# Patient Record
Sex: Female | Born: 1965 | Race: White | Hispanic: No | Marital: Married | Smoking: Former smoker
Health system: Southern US, Community
[De-identification: ages and names within clinical notes are randomized; demographics above are authoritative.]

## PROBLEM LIST (undated history)

## (undated) DIAGNOSIS — J449 Chronic obstructive pulmonary disease, unspecified: Secondary | ICD-10-CM

## (undated) HISTORY — PX: TUBAL LIGATION: SHX77

## (undated) HISTORY — PX: HERNIA REPAIR: SHX51

---

## 1999-07-22 ENCOUNTER — Encounter: Payer: Self-pay | Admitting: Family Medicine

## 1999-07-22 ENCOUNTER — Ambulatory Visit (HOSPITAL_COMMUNITY): Admission: RE | Admit: 1999-07-22 | Discharge: 1999-07-22 | Payer: Self-pay | Admitting: Family Medicine

## 2000-12-12 ENCOUNTER — Other Ambulatory Visit: Admission: RE | Admit: 2000-12-12 | Discharge: 2000-12-12 | Payer: Self-pay | Admitting: Obstetrics and Gynecology

## 2002-01-06 ENCOUNTER — Other Ambulatory Visit: Admission: RE | Admit: 2002-01-06 | Discharge: 2002-01-06 | Payer: Self-pay | Admitting: Obstetrics and Gynecology

## 2002-06-15 ENCOUNTER — Encounter: Admission: RE | Admit: 2002-06-15 | Discharge: 2002-06-15 | Payer: Self-pay | Admitting: Family Medicine

## 2002-06-15 ENCOUNTER — Encounter: Payer: Self-pay | Admitting: Family Medicine

## 2003-01-12 ENCOUNTER — Other Ambulatory Visit: Admission: RE | Admit: 2003-01-12 | Discharge: 2003-01-12 | Payer: Self-pay | Admitting: Obstetrics and Gynecology

## 2003-06-17 ENCOUNTER — Encounter: Admission: RE | Admit: 2003-06-17 | Discharge: 2003-06-17 | Payer: Self-pay | Admitting: Family Medicine

## 2004-01-18 ENCOUNTER — Other Ambulatory Visit: Admission: RE | Admit: 2004-01-18 | Discharge: 2004-01-18 | Payer: Self-pay | Admitting: Obstetrics and Gynecology

## 2004-05-10 ENCOUNTER — Other Ambulatory Visit: Admission: RE | Admit: 2004-05-10 | Discharge: 2004-05-10 | Payer: Self-pay | Admitting: Obstetrics and Gynecology

## 2004-09-04 ENCOUNTER — Other Ambulatory Visit: Admission: RE | Admit: 2004-09-04 | Discharge: 2004-09-04 | Payer: Self-pay | Admitting: Obstetrics and Gynecology

## 2006-02-13 ENCOUNTER — Encounter: Admission: RE | Admit: 2006-02-13 | Discharge: 2006-02-13 | Payer: Self-pay | Admitting: Family Medicine

## 2006-03-29 ENCOUNTER — Encounter: Admission: RE | Admit: 2006-03-29 | Discharge: 2006-03-29 | Payer: Self-pay | Admitting: Orthopedic Surgery

## 2007-07-11 ENCOUNTER — Encounter: Admission: RE | Admit: 2007-07-11 | Discharge: 2007-07-11 | Payer: Self-pay | Admitting: Family Medicine

## 2010-10-17 ENCOUNTER — Emergency Department (HOSPITAL_COMMUNITY): Payer: Managed Care, Other (non HMO)

## 2010-10-17 ENCOUNTER — Inpatient Hospital Stay (HOSPITAL_COMMUNITY)
Admission: EM | Admit: 2010-10-17 | Discharge: 2010-10-21 | DRG: 352 | Disposition: A | Discharge status: Home / Self Care | Payer: Managed Care, Other (non HMO) | Attending: General Surgery | Admitting: General Surgery

## 2010-10-17 DIAGNOSIS — IMO0001 Reserved for inherently not codable concepts without codable children: Principal | ICD-10-CM | POA: Diagnosis present

## 2010-10-17 DIAGNOSIS — F17200 Nicotine dependence, unspecified, uncomplicated: Secondary | ICD-10-CM | POA: Diagnosis present

## 2010-10-18 ENCOUNTER — Emergency Department (HOSPITAL_COMMUNITY): Payer: Managed Care, Other (non HMO)

## 2010-10-18 LAB — URINALYSIS, ROUTINE W REFLEX MICROSCOPIC
Glucose, UA: NEGATIVE mg/dL
Hgb urine dipstick: NEGATIVE
Specific Gravity, Urine: 1.019 (ref 1.005–1.030)
pH: 6.5 (ref 5.0–8.0)

## 2010-10-18 LAB — CBC
HCT: 40.8 % (ref 36.0–46.0)
MCV: 87.7 fL (ref 78.0–100.0)
RBC: 4.65 MIL/uL (ref 3.87–5.11)
RDW: 13.1 % (ref 11.5–15.5)
WBC: 13.9 10*3/uL — ABNORMAL HIGH (ref 4.0–10.5)

## 2010-10-18 LAB — POCT PREGNANCY, URINE: Preg Test, Ur: NEGATIVE

## 2010-10-18 LAB — LIPASE, BLOOD: Lipase: 72 U/L — ABNORMAL HIGH (ref 11–59)

## 2010-10-18 LAB — COMPREHENSIVE METABOLIC PANEL
ALT: 11 U/L (ref 0–35)
Calcium: 9.5 mg/dL (ref 8.4–10.5)
Chloride: 99 mEq/L (ref 96–112)
GFR calc Af Amer: >60 mL/min (ref >60)
GFR calc non Af Amer: >60 mL/min (ref >60)
Glucose, Bld: 112 mg/dL — ABNORMAL HIGH (ref 70–99)
Potassium: 3.9 mEq/L (ref 3.5–5.1)

## 2010-10-18 LAB — DIFFERENTIAL
Basophils Absolute: 0 10*3/uL (ref 0.0–0.1)
Lymphocytes Relative: 16 % (ref 12–46)
Lymphs Abs: 2.2 10*3/uL (ref 0.7–4.0)
Neutro Abs: 10.6 10*3/uL — ABNORMAL HIGH (ref 1.7–7.7)
Neutrophils Relative %: 76 % (ref 43–77)

## 2010-10-18 LAB — APTT: aPTT: 33 seconds (ref 24–37)

## 2010-10-18 LAB — PROTIME-INR: INR: 0.94 (ref 0.00–1.49)

## 2010-10-19 LAB — CBC
MCH: 29.5 pg (ref 26.0–34.0)
MCHC: 32.7 g/dL (ref 30.0–36.0)
Platelets: 285 10*3/uL (ref 150–400)
RBC: 3.56 MIL/uL — ABNORMAL LOW (ref 3.87–5.11)
RDW: 13.4 % (ref 11.5–15.5)

## 2010-10-24 NOTE — Op Note (Signed)
Vanessa Holloway, Vanessa Holloway      ACCOUNT NO.:  [id number]  MEDICAL RECORD NO.:  [id number]           PATIENT TYPE:  I  LOCATION:  1536                         FACILITY:  [facility]  PHYSICIAN:  Sharlet Salina T. Saydee Zolman, M.D.DATE OF BIRTH:  [birth year]  DATE OF PROCEDURE:  10/17/2010 DATE OF DISCHARGE:                              OPERATIVE REPORT   PREOPERATIVE DIAGNOSIS:  Incarcerated left inguinal hernia  POSTOPERATIVE DIAGNOSIS:  Incarcerated left femoral hernia.  SURGICAL PROCEDURES: 1. Repair of incarcerated left femoral hernia. 2. Exploratory laparotomy.  SURGEON:  Lorne Skeens. Sufyan Meidinger, M.D.  ANESTHESIA:  General.  BRIEF HISTORY:  Vanessa Holloway is a 45 year old female who has had intermittent bulge in her left groin for several months.  She presents today with at least 6 hours of persistent painful mass in the left groin with nausea.  Examination shows a 5-cm very tender mass at the inguinal canal consistent with an incarcerated hernia.  I have recommended emergency repair.  I discussed that bowel resection may be necessary. Risks of bleeding, infection, recurrence were discussed and understood. She is now brought to operating room for this procedure.  DESCRIPTION OF OPERATION:  The patient was brought to the operating room, placed in supine position on the operating room table and general endotracheal anesthesia was induced.  She received preoperative IV antibiotics.  PAS were in place.  The abdomen was widely sterilely prepped and draped.  Correct patient and procedure were verified.  I then made an oblique incision in the left groin overlying the mass. Dissection was carried down through the subcutaneous tissue and Scarpa fascia.  I came down upon the hernia sac which was dissected away from surrounding tissue.  The sac was beneath the inguinal ligament coming through the femoral canal.  I then opened the hernia sac which contained bloody fluid and a very dark short  segment of small intestine which was incarcerated in the hernia.  I was able to free this up by dividing a small portion of the inguinal ligament medially at the defect.  The bowel remained extremely dark and I suspected quite likely nonviable. Due to the very tight femoral space, I was really not able to free up the bowel enough to  carefully inspect or allow good blood flow to it and I felt at this point I had to return the bowel to the abdominal cavity, plan a minilaparotomy to probably resect the small bowel.  The bowel was returned to the abdominal cavity.  The hernia sac was excised with cautery and closed with a pursestring 3-0 Vicryl suture.  The femoral defect was then closed from below the inguinal ligament, suturing the inguinal ligament down to Cooper's ligament beginning at the lacunar ligament and working over to and carefully avoiding the femoral vein with several interrupted 0 Prolene sutures.  Soft tissue was then infiltrated with Marcaine.  Scarpa fascia was closed with a running 3-0 Vicryl, the skin closed with running subcuticular 4-0 Monocryl.  Then, again in order to rule out necrotic bowel and expecting likely to require bowel resection, I made a small about 5 cm laparotomy incision in the midline below the umbilicus.  Dissection was carried down through  subcutaneous tissue in midline fashion.  The peritoneum entered.  The Alexis wound retractor was placed.  The small bowel was then run and the involved segment brought out through the wound.  At this point, it had pinked up significantly and although was hemorrhagic over about a 2-3 cm length it did clearly appear viable and I did not feel that it needed to be resected.  There was not any cloudy or bloody peritoneal fluid.  Therefore, the bowel was returned to the abdominal cavity and the wound was closed with running #1 PDS begun either end and tied centrally.  Subcu was closed with running 3-0 Vicryl, skin  with subcuticular Monocryl and Dermabond.  Sponge and needle count was correct.  Dermabond was applied to both incisions.  The patient was taken to recovery in good condition.     Lorne Skeens. [place]re Harbor, M.D.     Tory Emerald  D:  10/18/2010  T:  10/18/2010  Job:  [id number]  Electronically Signed by Glenna Fellows M.D. on 10/24/2010 10:11:03 AM

## 2010-10-24 NOTE — H&P (Signed)
Vanessa Holloway, Vanessa Holloway      ACCOUNT NO.:  [id number]  MEDICAL RECORD NO.:  [id number]           PATIENT TYPE:  E  LOCATION:  WLED                         FACILITY:  [facility]  PHYSICIAN:  Sharlet Salina T. Mycheal Veldhuizen, M.D.DATE OF BIRTH:  11-11-1965  DATE OF ADMISSION:  10/17/2010 DATE OF DISCHARGE:                             HISTORY & PHYSICAL   CHIEF COMPLAINT:  Painful lump, left groin.  HISTORY OF PRESENT ILLNESS:  Vanessa Holloway is a generally healthy 45- year-old female, who about 4 months ago developed a nonpainful intermittent bulge in her left groin.  She did see her primary care physician at that point, but was told it was likely a pulled muscle. She had some intermittent swelling related activity in the left groin, but no pain since that time.  However, today after working out in [employer], she developed burning, diffuse lower abdominal pain and then a much increased, very painful lump in the left groin, which has persisted. This has now been present for about 8 or 10 hours.  The patient presented to [facility] Emergency Room and I was asked to evaluate her.  PAST MEDICAL HISTORY:  She has not had any previous surgery or serious medical illness.  MEDICATIONS:  P.r.n., naproxen and Chantix or tobacco cessation  ALLERGIES:  None.  SOCIAL HISTORY:  Married.  Drinks occasional alcohol.  Currently, smoking cigarettes, trying to quit.  FAMILY HISTORY:  Her father had incarcerated inguinal hernia.  REVIEW OF SYSTEMS:  GENERAL:  No fever, chills, weight change. HEENT:  No vision, hearing, swallowing problems. RESPIRATORY:  No shortness of breath, cough, history of lung disease. CARDIAC:  No chest pain, palpitation swelling, history of heart disease. ABDOMEN/GI:  She has some nausea.  No vomiting.  PHYSICAL EXAMINATION:  VITAL SIGNS:  She is afebrile, heart rate 76, blood pressure 109/75, respirations 18. GENERAL:  Well-developed, alert, white female, somewhat  uncomfortable. SKIN:  Warm and dry.  No rash or infection. HEENT:  No palpable mass or thyromegaly  Sclerae nonicteric.  Oropharynx clear. LYMPH NODES:  No cervical or subclavicular nodes palpable. LUNGS:  Clear without wheezing or increased work of breathing. CARDIAC:  Regular rate and rhythm.  No murmurs. ABDOMEN:  Mild distention.  Bowel sounds are hyperactive. ABDOMEN:  Generally soft and nontender.  There is a 5 cm fusiform exquisitely tender lump in the left inguinal canal without any overlying edema or erythema.  Not reducible.  Right groin is negative. EXTREMITIES:  No joint swelling or deformity. NEUROLOGIC:  Alert, oriented.  Motor sensation is grossly normal.  LABORATORY DATA:  Lactic acid, lipase, electrolytes, LFTs all within normal limits.  White count elevated at 13.9, hemoglobin 35.  Pregnancy negative.  Urinalysis negative.  DIAGNOSTIC STUDIES:  Flat and upright of abdomen shows a clear chest, few air-fluid levels, left upper quadrant consistent with ileus without dilated bowel.  ASSESSMENT AND PLAN:  Apparent incarcerated left inguinal hernia.  Her history is classic.  Could conceivably be lymphadenopathy or groin abscess, but this is extremely unlikely.  She may well have bowel incarceration.  I have recommended emergency exploration repair under general anesthesia.  The patient is admitted for this procedure.  Vanessa Holloway, M.D.     Vanessa Holloway  D:  10/18/2010  T:  10/18/2010  Job:  [id number]  Electronically Signed by Glenna Fellows M.D. on 10/24/2010 10:10:57 AM

## 2010-11-02 NOTE — Discharge Summary (Addendum)
NAMEKATHLYNN, Holloway      ACCOUNT NO.:  [id number]  MEDICAL RECORD NO.:  [id number]  LOCATION:  1536                         FACILITY:  [facility]  PHYSICIAN:  Angelia Mould. Derrell Lolling, M.D.DATE OF BIRTH:  Jan 10, 1966  DATE OF ADMISSION:  10/17/2010 DATE OF DISCHARGE:  10/21/2010                              DISCHARGE SUMMARY   DISCHARGING PHYSICIAN:  Angelia Mould. Derrell Lolling, MD  CONSULTANTS:  None.  PROCEDURE:  Repair of incarcerated left femoral hernia with exploratory laparotomy by Dr. Glenna Fellows on Oct 17, 2010.  REASON FOR ADMISSION:  Ms. Deboy is a healthy 45 year old female who developed a nonpainful intermittent bulge in her left groin approximately 4 months ago.  She saw her primary care physician who thought it was likely a pulled muscle.  She had intermittent swelling, related to activity, but no pain.  Today during a workout, she developed burning, diffuse lower abdominal pain that has significantly increased as well as a very painful lump in the left groin.  The patient presented to [facility] Emergency Department for further evaluation.  She had a plain abdominal x-ray which revealed a few air-fluid levels, consistent with ileus without dilated bowel.  After evaluation, she was found to have an incarcerated left inguinal hernia. Please see admitting history and physical for further details.  ADMITTING DIAGNOSIS:  Apparent incarcerated left inguinal hernia.  HOSPITAL COURSE:  At this time, the patient was admitted.  It was felt the patient would need emergent repair of her hernia.  Therefore, she was taken to the operating room where she underwent a repair of an incarcerated left inguinal hernia with an exploratory laparotomy to ensure that there was no ischemic bowel.  There was no ischemic bowel. The patient tolerated the procedure well.  Her postoperative course was quite uneventful. On postoperative day #0, the patient was kept n.p.o. except for ice  chips.  She was encouraged to ambulate and get out of bed.  The following day, the patient was not passing any flatus, however, she was hungry and did have active bowel sounds.  At this time, she was started on clear liquids.  Over the next several days, her diet was advanced as tolerated.  By postoperative day #3, the patient was doing well.  She was tolerating a regular diet and her pain was well controlled.  On exam, her abdomen was soft and mildly tender.  Her scar was clean and dry.  She did have active bowel sounds.  At this time, the patient was felt stable for discharge home.  DISCHARGE DIAGNOSIS:  Incarcerated left femoral hernia, status post repair with exploratory laparotomy.  DISCHARGE MEDICATIONS:  Please see medication reconciliation form.  DISCHARGE INSTRUCTIONS:  The patient may increase her activity slowly and walk up steps.  She may shower, however, she is not to bathe.  She is not to do any heavy lifting for the next 4 weeks.  She is not to drive for the next 1 week.  She may resume a normal diet.  She may follow up with Dr. Johna Sheriff in the next 2-3 weeks.Letha Cape, PA   ______________________________ Angelia Mould. Derrell Lolling, M.D.    KEO/MEDQ  D:  11/01/2010  T:  11/02/2010  Job:  [id number]  cc:   Lorne Skeens. Hoxworth, M.D. [address], [address] [place] [address]  Electronically Signed by Claud Kelp M.D. on 11/02/2010 04:13:27 PM Electronically Signed by Barnetta Chapel PA on 11/09/2010 10:30:33 AM

## 2010-11-03 ENCOUNTER — Encounter (HOSPITAL_COMMUNITY): Payer: Self-pay

## 2010-11-03 ENCOUNTER — Other Ambulatory Visit (HOSPITAL_COMMUNITY): Payer: Self-pay | Admitting: General Surgery

## 2010-11-03 ENCOUNTER — Inpatient Hospital Stay (HOSPITAL_COMMUNITY)
Admission: EM | Admit: 2010-11-03 | Discharge: 2010-11-08 | DRG: 390 | Disposition: A | Discharge status: Home / Self Care | Payer: Managed Care, Other (non HMO) | Source: Ambulatory Visit | Attending: General Surgery | Admitting: General Surgery

## 2010-11-03 ENCOUNTER — Other Ambulatory Visit: Payer: Self-pay | Admitting: General Surgery

## 2010-11-03 DIAGNOSIS — F17200 Nicotine dependence, unspecified, uncomplicated: Secondary | ICD-10-CM | POA: Diagnosis present

## 2010-11-03 DIAGNOSIS — K56609 Unspecified intestinal obstruction, unspecified as to partial versus complete obstruction: Principal | ICD-10-CM | POA: Diagnosis present

## 2010-11-03 LAB — COMPREHENSIVE METABOLIC PANEL
ALT: 19 U/L (ref 0–35)
AST: 18 U/L (ref 0–37)
Albumin: 3.7 g/dL (ref 3.5–5.2)
Alkaline Phosphatase: 57 U/L (ref 39–117)
Chloride: 98 mEq/L (ref 96–112)
Potassium: 3.8 mEq/L (ref 3.5–5.1)
Sodium: 135 mEq/L (ref 135–145)
Total Bilirubin: 0.3 mg/dL (ref 0.3–1.2)

## 2010-11-03 LAB — CBC
HCT: 38.7 % (ref 36.0–46.0)
Platelets: 442 10*3/uL — ABNORMAL HIGH (ref 150–400)
RDW: 13.3 % (ref 11.5–15.5)
WBC: 17.4 10*3/uL — ABNORMAL HIGH (ref 4.0–10.5)

## 2010-11-03 LAB — CBC WITH DIFFERENTIAL/PLATELET
HCT: 39.2 % (ref 36.0–46.0)
Lymphocytes Relative: 17 % (ref 12–46)
MCHC: 34.9 g/dL (ref 30.0–36.0)
Monocytes Absolute: 0.6 10*3/uL (ref 0.1–1.0)
Neutro Abs: 10.9 10*3/uL — ABNORMAL HIGH (ref 1.7–7.7)
Neutrophils Relative %: 79 % — ABNORMAL HIGH (ref 43–77)
Platelets: 448 10*3/uL — ABNORMAL HIGH (ref 150–400)
RDW: 13.4 % (ref 11.5–15.5)
WBC: 13.9 10*3/uL — ABNORMAL HIGH (ref 4.0–10.5)

## 2010-11-03 MED ORDER — IOHEXOL 300 MG/ML  SOLN
80.0000 mL | Freq: Once | INTRAMUSCULAR | Status: AC | PRN
  Administered 2010-11-03: 80 mL via INTRAVENOUS

## 2010-11-04 ENCOUNTER — Inpatient Hospital Stay (HOSPITAL_COMMUNITY): Payer: Managed Care, Other (non HMO)

## 2010-11-05 ENCOUNTER — Inpatient Hospital Stay (HOSPITAL_COMMUNITY): Payer: Managed Care, Other (non HMO)

## 2010-11-05 LAB — CBC
HCT: 37.2 % (ref 36.0–46.0)
MCH: 29.2 pg (ref 26.0–34.0)
MCV: 87.7 fL (ref 78.0–100.0)
Platelets: 409 10*3/uL — ABNORMAL HIGH (ref 150–400)
RDW: 13.8 % (ref 11.5–15.5)

## 2010-11-05 LAB — BASIC METABOLIC PANEL
BUN: 12 mg/dL (ref 6–23)
CO2: 34 mEq/L — ABNORMAL HIGH (ref 19–32)
Calcium: 9.1 mg/dL (ref 8.4–10.5)
Creatinine, Ser: 0.73 mg/dL (ref 0.4–1.2)
GFR calc Af Amer: >60 mL/min (ref >60)

## 2010-11-06 LAB — BASIC METABOLIC PANEL
BUN: 10 mg/dL (ref 6–23)
Chloride: 106 mEq/L (ref 96–112)
Creatinine, Ser: 0.62 mg/dL (ref 0.4–1.2)
GFR calc Af Amer: >60 mL/min (ref >60)
GFR calc non Af Amer: >60 mL/min (ref >60)

## 2010-11-06 LAB — CBC
MCHC: 33.1 g/dL (ref 30.0–36.0)
MCV: 87.3 fL (ref 78.0–100.0)
Platelets: 398 10*3/uL (ref 150–400)
RDW: 13.7 % (ref 11.5–15.5)
WBC: 6.4 10*3/uL (ref 4.0–10.5)

## 2010-11-09 NOTE — H&P (Signed)
NAMEASLI, TOKARSKI      ACCOUNT NO.:  [id number]  MEDICAL RECORD NO.:  [id number]  LOCATION:  5125                         FACILITY:  [facility]  PHYSICIAN:  Almond Lint, MD       DATE OF BIRTH:  1965-06-25  DATE OF ADMISSION:  11/03/2010 DATE OF DISCHARGE:                             HISTORY & PHYSICAL   CHIEF COMPLAINT:  Small bowel obstruction.  HISTORY OF PRESENT ILLNESS:  [name] is a 45 year old female who underwent femoral hernia repair and minilaparotomy on Oct 18, 2010 for an incarcerated femoral hernia.  At that time she had an area of loops of bowel that appeared slightly dusky.  The minilaparotomy was made and the loop of bowel disappeared, slightly hemorrhagic but had pinked up. She was able to go home on 26 without incident.  Dr. Johna Sheriff saw her in clinic and she was doing well.  Today, however, she came to clinic because she was having a burning abdominal pain, severe nausea, vomiting, and flushing.  She underwent CT scanning which is positive for bowel obstruction.  She tried to have bowel movements to help but this did not.  Her last bowel movement was around 16 hours ago.  She thinks she passed some gas at that time.  She has complained of fevers and sweats.  She has not been able to keep anything down.  PAST MEDICAL HISTORY:  Otherwise negative.  PAST SURGICAL HISTORY:  Femoral hernia repair and minilaparotomy as described above.  FAMILY HISTORY:  Her father had an incarcerated inguinal hernia.  SOCIAL HISTORY:  She is trying to quit tobacco.  Drinks alcohol occasionally and does not use drugs.  She is married.  REVIEW OF SYSTEMS:  Otherwise negative x11.  MEDICATIONS:  Naproxen and Chantix.  ALLERGIES:  None.  PHYSICAL EXAMINATION:  VITAL SIGNS:  Temperature 97.9, pulse 77, blood pressure 123/82, respiratory rate 18, sats 100% on room air. GENERAL:  Alert and oriented x3 in no acute distress. HEENT:  Normocephalic, atraumatic.   Sclerae are anicteric. NECK:  Supple.  No lymphadenopathy, no thyromegaly.  Trachea is midline. HEART:  Regular rate and rhythm.  No murmurs, rubs, or gallops. LUNGS:  Clear to auscultation bilaterally. ABDOMEN:  Soft, mildly distended, hypoactive bowel sounds.  No hepatosplenomegaly.  She states her abdomen is less hard than it was when the NG tube went in.  EXTREMITIES:  Warm and well-perfused without pitting edema.  No gross motor sensory deficits. PSYCH:  Mood and affect are normal.  White count 17.4, hemoglobin 13.6, hematocrit 38.7, platelet count 442,000.  BMP:  Sodium 135, potassium 3.8, chloride 98, CO2 26, glucose 126, BUN 16, creatinine 0.6, bilirubin 0.3, alk phos 57, AST 18, ALT 19, albumin 3.7.  CT scan of the abdomen and pelvis with p.o. and IV contrast demonstrates small bowel loops that are dilated with transition point distally and collapsed, moderate amount of free fluid in the pelvis.  ASSESSMENT:  Vanessa Holloway is a 45 year old female with lysis of small bowel obstruction following a femoral hernia repair.PLAN:  IV fluids, n.p.o., NG tube, pain control, anti-emetics and if her bowel obstruction does not resolve, she may require operative exploration.     Almond Lint, MD     FB/MEDQ  D:  11/03/2010  T:  11/04/2010  Job:  [id number]  Electronically Signed by Almond Lint MD on 11/09/2010 02:10:59 PM

## 2010-11-17 ENCOUNTER — Encounter (INDEPENDENT_AMBULATORY_CARE_PROVIDER_SITE_OTHER): Payer: Self-pay | Admitting: General Surgery

## 2010-11-23 NOTE — Discharge Summary (Signed)
  NAMEJAZZELLE, Vanessa Holloway NO.:  [id number]  MEDICAL RECORD NO.:  [id number]  LOCATION:  5125                         FACILITY:  [facility]  PHYSICIAN:  Ardeth Sportsman, MD     DATE OF BIRTH:  1965-11-22  DATE OF ADMISSION:  11/03/2010 DATE OF DISCHARGE:  11/08/2010                              DISCHARGE SUMMARY   ADMISSION DIAGNOSES: 1. Small bowel obstruction; status post incarcerated hernia repair, Oct 18, 2010, Dr. Johna Sheriff. 2. Tobacco use.  DISCHARGE DIAGNOSES: 1. Small bowel obstruction; status post incarcerated hernia repair, Oct 18, 2010, Dr. Johna Sheriff. 2. Tobacco use.  PROCEDURES:  None.  BRIEF HISTORY:  The patient is a 45 year old female who underwent laparotomy repair of incarcerated hernia,10/18/10.  She had some bowel loops that was slightly dusky at the time of surgery.  The bowel improved at the time of surgery,  and she did well. She has been seen back in the clinic and did well up until the day of admission.  She developed some burning discomfort, nausea, vomiting, and flushing at that time. She was seen in the office and a CT scan revealed a small bowel obstruction.  She was subsequently admitted.  For further history and physical, please see the dictated note.  HOSPITAL COURSE:  The patient was admitted.  Her white count was 17.4 on admission.  Electrolytes and LFTs were normal.  She was placed on bowel rest, fluid hydration and she slowly improved.  She was started on clear liquids on November 06, 2010 and after receiving Dulcolax had a bowel movement in the afternoon of June 11 and then one around midnight the following morning. We advanced her to full liquids on June 12.  She was seen by Dr. Johna Sheriff.  It was his opinion that further studies at this point were not necessary.  He recommended that if she did well she could go home on full liquids and follow up with him next week.  Yesterday, June 12, she has tolerated full liquid.  She had  one stool last night.  She was having some flatus, not a great deal.  Her incision is healing nicely. She is ambulating without difficulty.  The patient was given the option of staying longer or going home and she chose to go home.  We will send her home on full liquids and leave her on full liquids for the next 72 hours and then advance to regular soft diet if she is tolerating that well, having bowel movements and passing flatus normally.  She is to call and reschedule an appointment with Dr. Johna Sheriff for next week.  CONDITION ON DISCHARGE:  Improving.     Eber Hong, P.A.   ______________________________ Ardeth Sportsman, MD    WDJ/MEDQ  D:  11/08/2010  T:  11/09/2010  Job:  [id number]  Electronically Signed by Sherrie George P.A. on 11/17/2010 09:04:01 PM Electronically Signed by Karie Soda MD on 11/23/2010 07:13:50 AM

## 2011-03-20 ENCOUNTER — Other Ambulatory Visit: Payer: Self-pay | Admitting: Obstetrics and Gynecology

## 2011-03-20 DIAGNOSIS — R928 Other abnormal and inconclusive findings on diagnostic imaging of breast: Secondary | ICD-10-CM

## 2011-03-30 ENCOUNTER — Ambulatory Visit
Admission: RE | Admit: 2011-03-30 | Discharge: 2011-03-30 | Disposition: A | Discharge status: Home / Self Care | Payer: Managed Care, Other (non HMO) | Source: Ambulatory Visit | Attending: Obstetrics and Gynecology | Admitting: Obstetrics and Gynecology

## 2011-03-30 DIAGNOSIS — R928 Other abnormal and inconclusive findings on diagnostic imaging of breast: Secondary | ICD-10-CM

## 2011-12-04 ENCOUNTER — Ambulatory Visit
Admission: RE | Admit: 2011-12-04 | Discharge: 2011-12-04 | Disposition: A | Discharge status: Home / Self Care | Payer: Managed Care, Other (non HMO) | Source: Ambulatory Visit | Attending: Family Medicine | Admitting: Family Medicine

## 2011-12-04 ENCOUNTER — Other Ambulatory Visit: Payer: Self-pay | Admitting: Family Medicine

## 2011-12-04 DIAGNOSIS — M25512 Pain in left shoulder: Secondary | ICD-10-CM

## 2011-12-04 DIAGNOSIS — M542 Cervicalgia: Secondary | ICD-10-CM

## 2011-12-04 DIAGNOSIS — M25511 Pain in right shoulder: Secondary | ICD-10-CM

## 2012-04-12 IMAGING — CT CT ABD-PELV W/ CM
2 of 5 series · 17 of 46 positions shown, 19 images · IV contrast (agent unspecified)
Comparison: None

CLINICAL DATA: Evaluate for small bowel obstruction status post
hernia surgery

CT ABDOMEN AND PELVIS WITH CONTRAST
TECHNIQUE: Multidetector CT imaging of the abdomen and pelvis was
performed following the standard protocol during bolus
administration of intravenous contrast.
Contrast: 80 ml of omni 300

[Series 2: routine · axial · 0.71mm/px · z∈[-517,-102]mm · 14 of 95 slices shown, 16 images]
[im 6/95  soft-tissue]
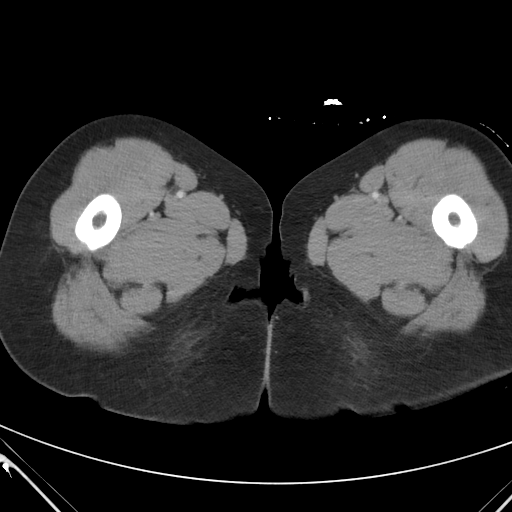
[im 6/95  bone]
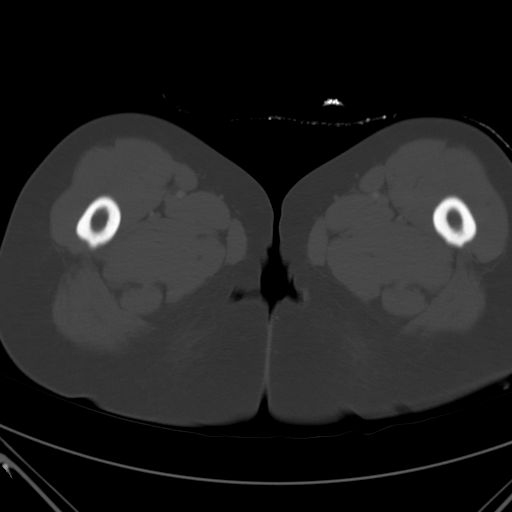
[im 11/95  soft-tissue]
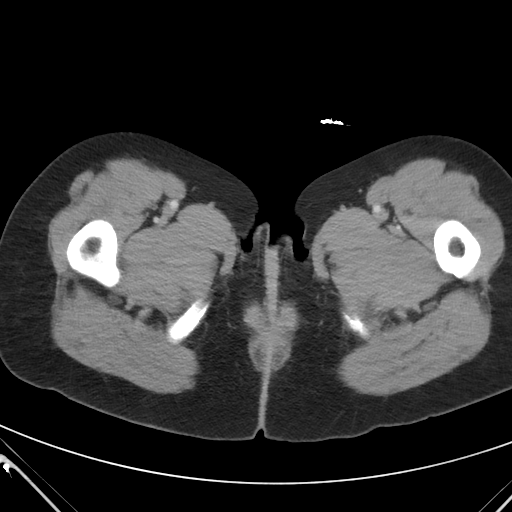
[im 21/95  soft-tissue]
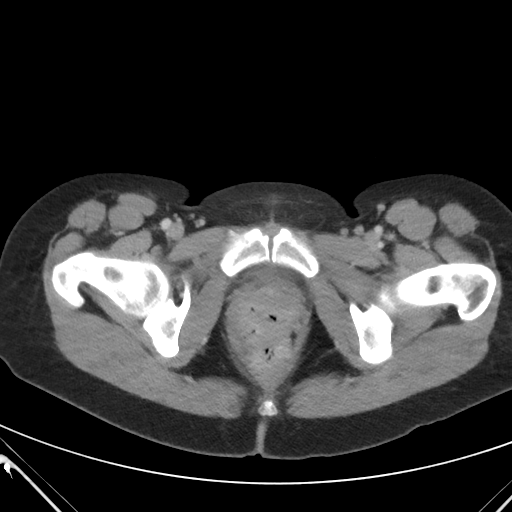
[im 27/95  soft-tissue]
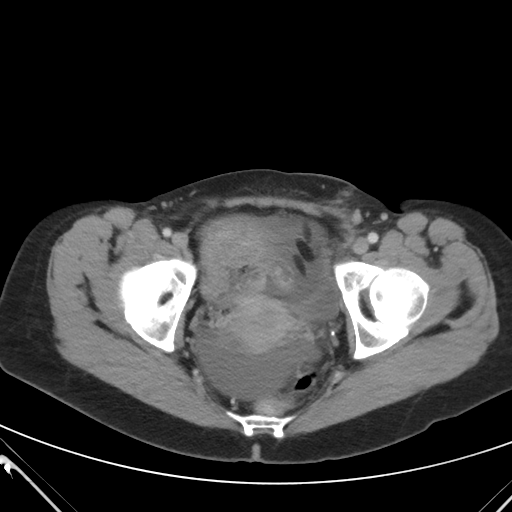
[im 32/95  soft-tissue]
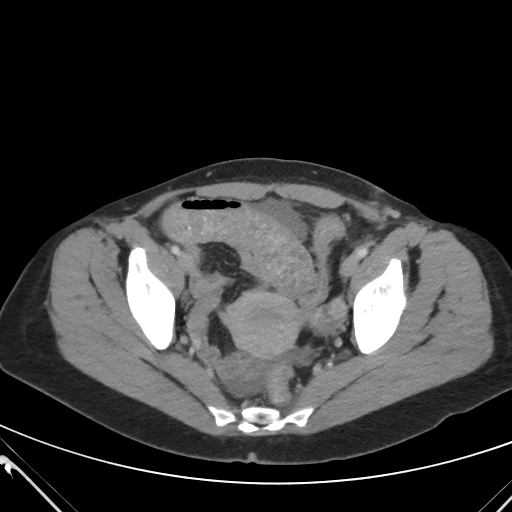
[im 37/95  soft-tissue]
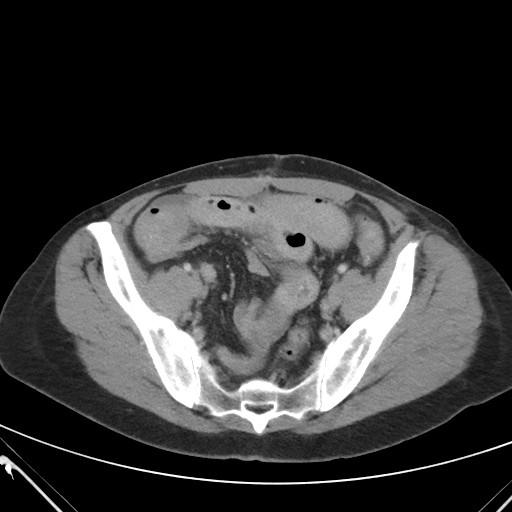
[im 42/95  soft-tissue]
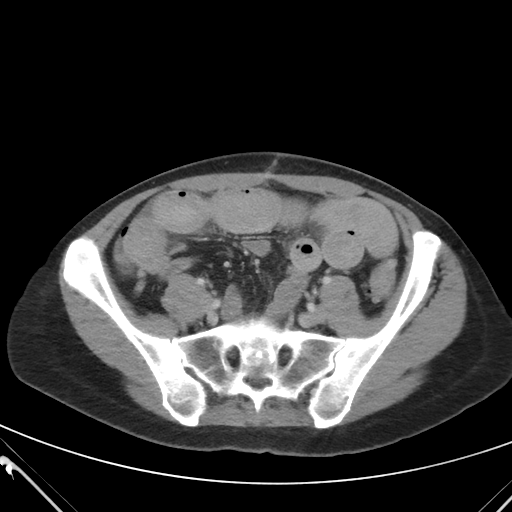
[im 53/95  soft-tissue]
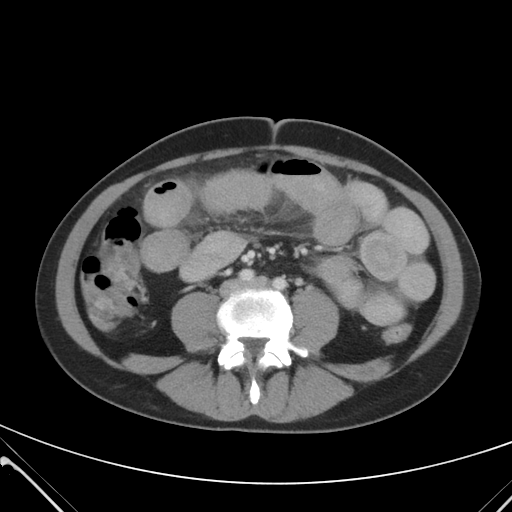
[im 58/95  soft-tissue]
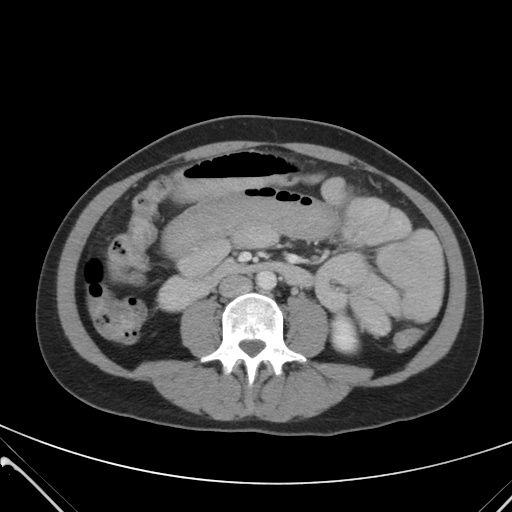
[im 58/95  bone]
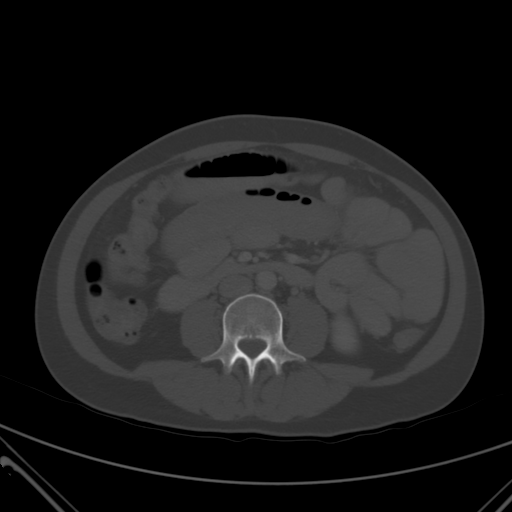
[im 63/95  soft-tissue]
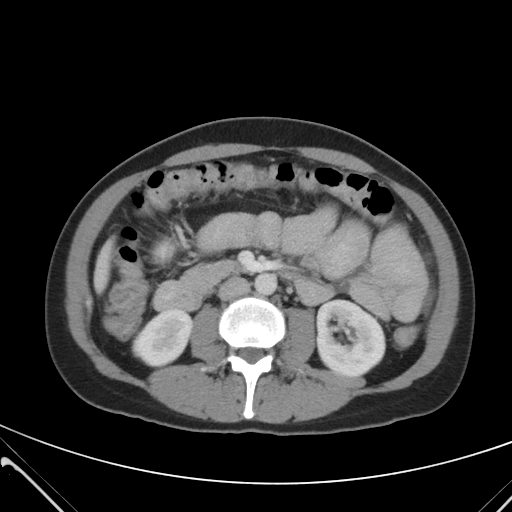
[im 68/95  soft-tissue]
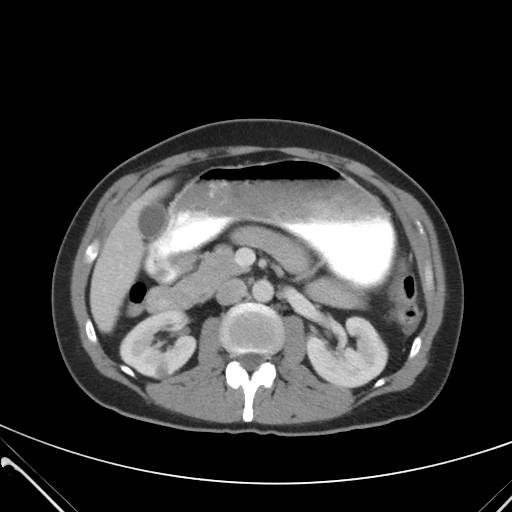
[im 74/95  soft-tissue]
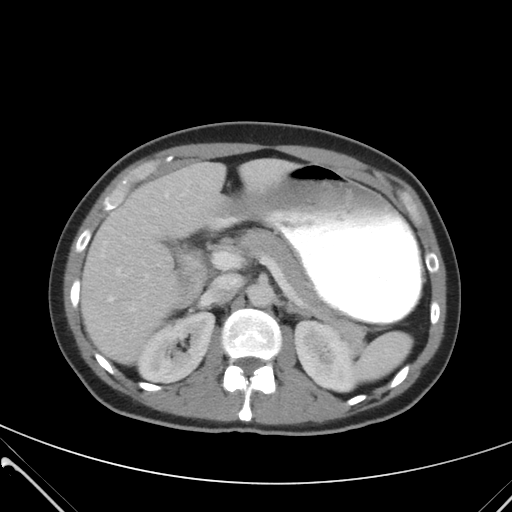
[im 84/95  soft-tissue]
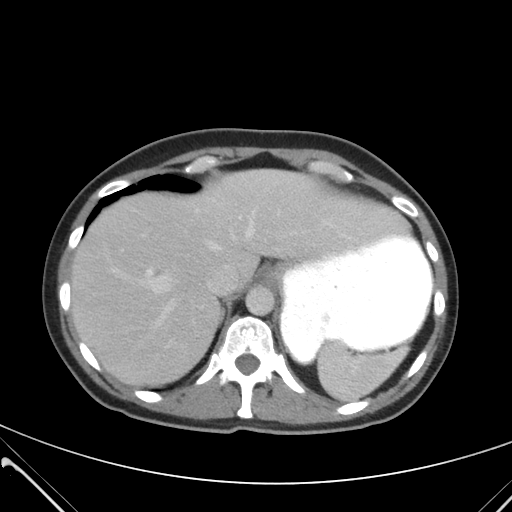
[im 89/95  soft-tissue]
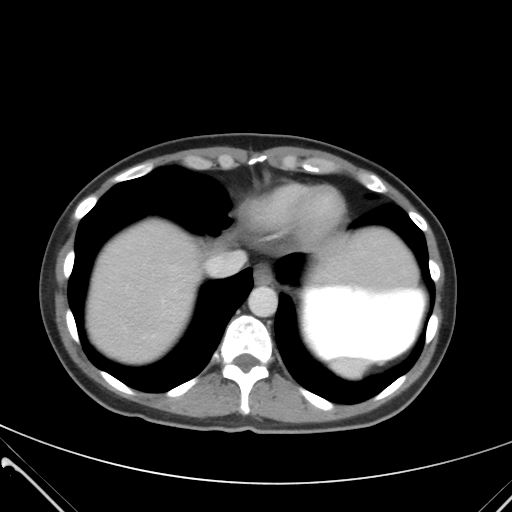

[mpr, coronals, coronal · coronal · 0.92mm/px · 3 of 88 slices shown]
[im 30/88  soft-tissue]
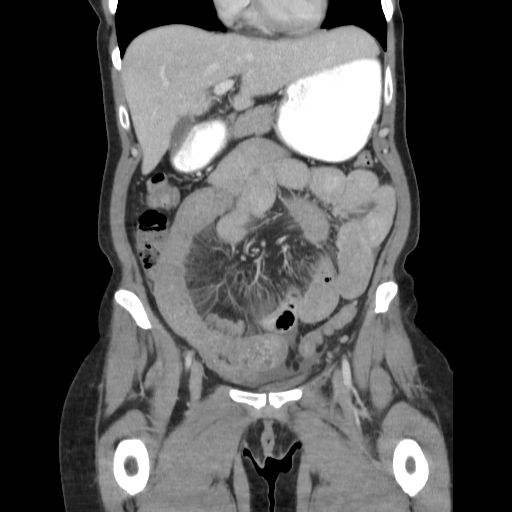
[im 39/88  soft-tissue]
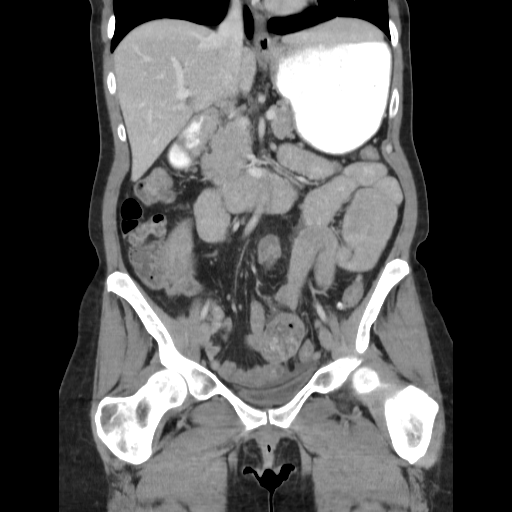
[im 49/88  soft-tissue]
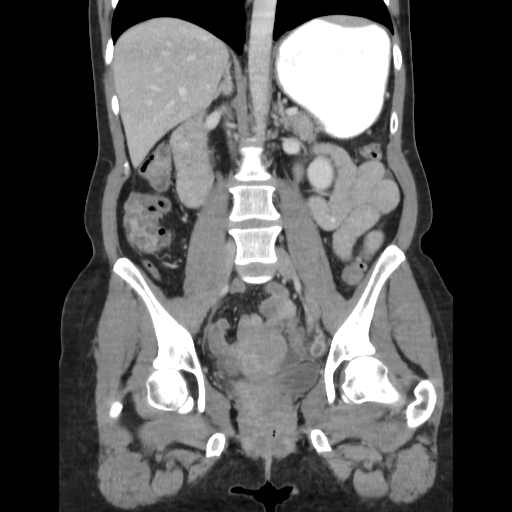

[17 of 46 positions shown; findings below may reference images not displayed]

FINDINGS: The lung bases are clear.

No suspicious liver abnormalities.

The gallbladder appears normal.

The common bile duct has a normal caliber.

The pancreas appears normal.

The adrenal glands are within normal limits.

There is no focal splenic abnormalities identified.

Right renal cyst.  Left kidney negative.

There are no enlarged upper abdominal lymph nodes.  There are no
enlarged pelvic or inguinal lymph nodes.

Distention of the gastric lumen.  Small bowel loops are dilated
measuring up to the proximal and mid small bowel loops are dilated.
The distal ileum is collapsed.  The appendix is identified and
appears normal.  The proximal colon appears normal.  Sigmoid
diverticula noted without evidence for acute diverticulitis.
Transition point is identified within the pelvis, slightly ecentric
to the left of the midline.  This is best seen on coronal image
number 48.

Moderate free fluid identified within the pelvis.

There are no specific features to suggest bowel perforation.  No
abscess is identified.
IMPRESSION: 1.  Examination is positive for small bowel obstruction.  The
transition point is in the pelvis at the level of the mid small
bowel.  The distal ileum appears collapsed.

2.  Moderate free fluid within the pelvis.

## 2012-04-14 IMAGING — CR DG ABDOMEN 2V
2 series · 2 of 2 positions shown · non-contrast
Comparison: Two views abdomen 11/04/2010.

CLINICAL DATA: Small bowel obstruction.

ABDOMEN - 2 VIEW

[w abdomen upright]
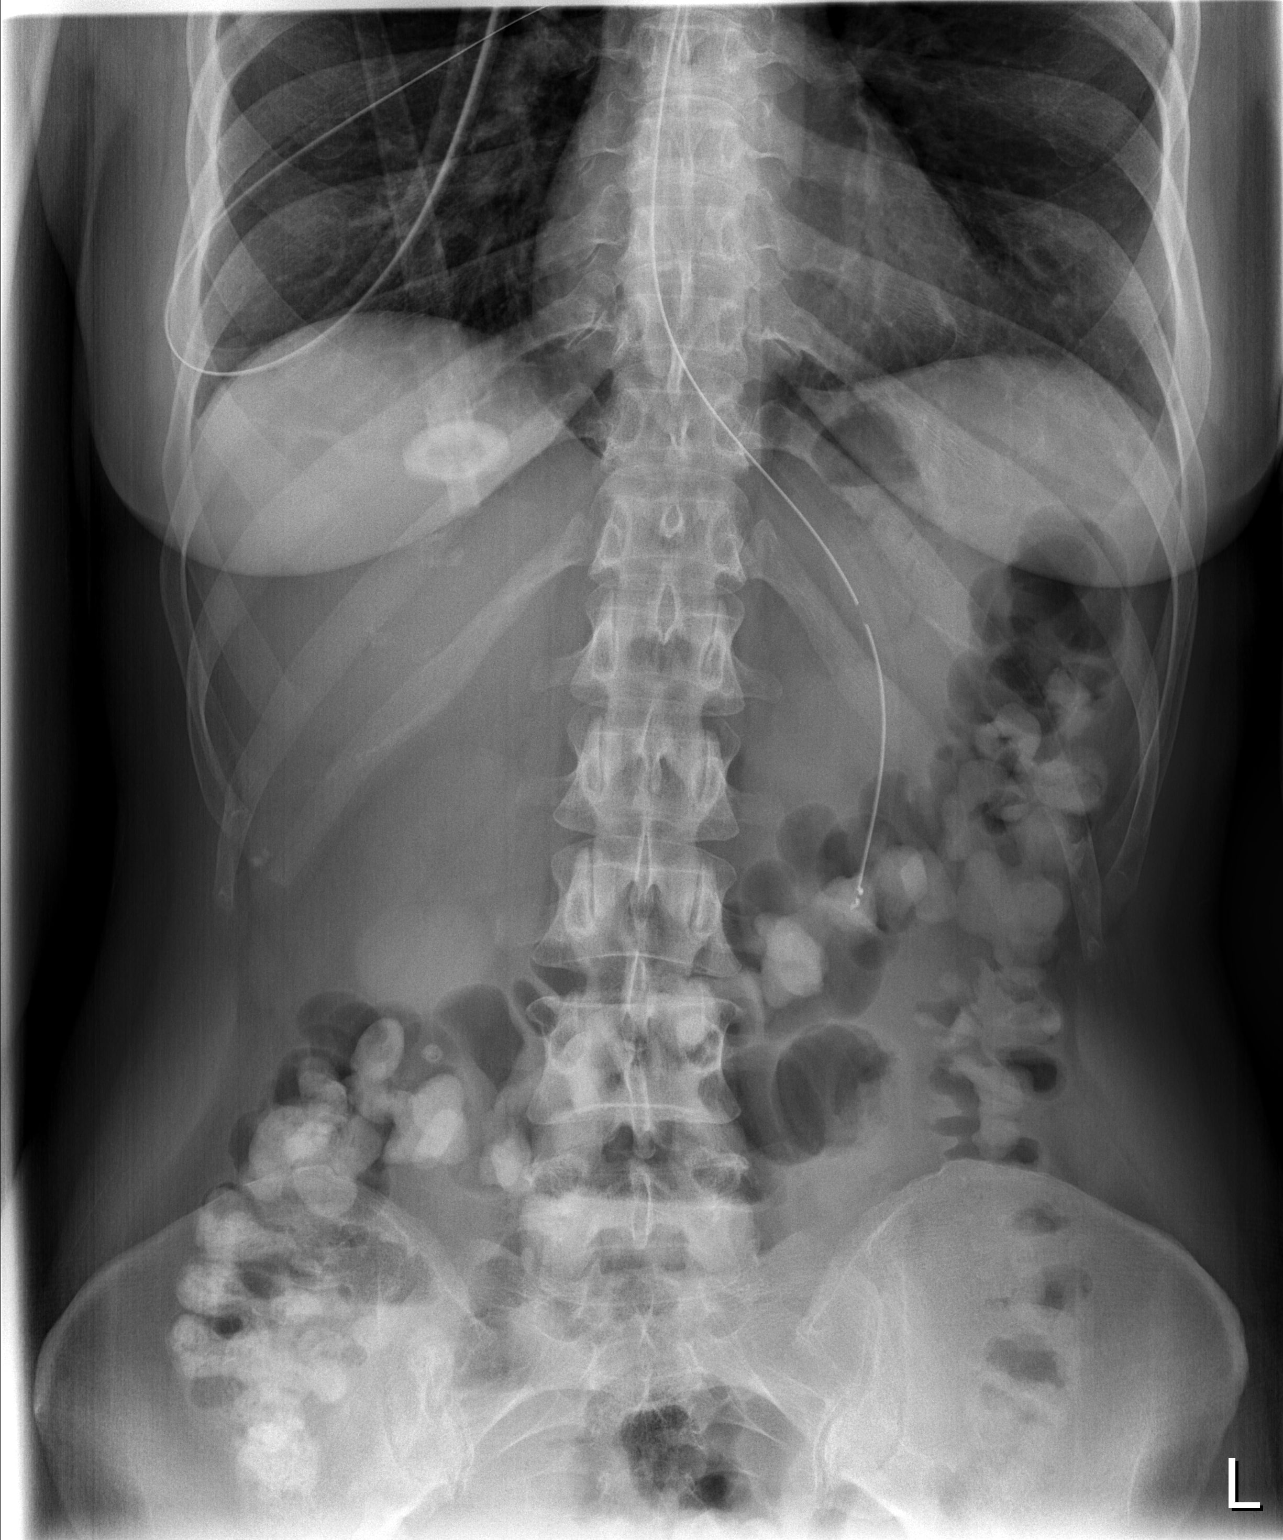

[t abdomen supine]
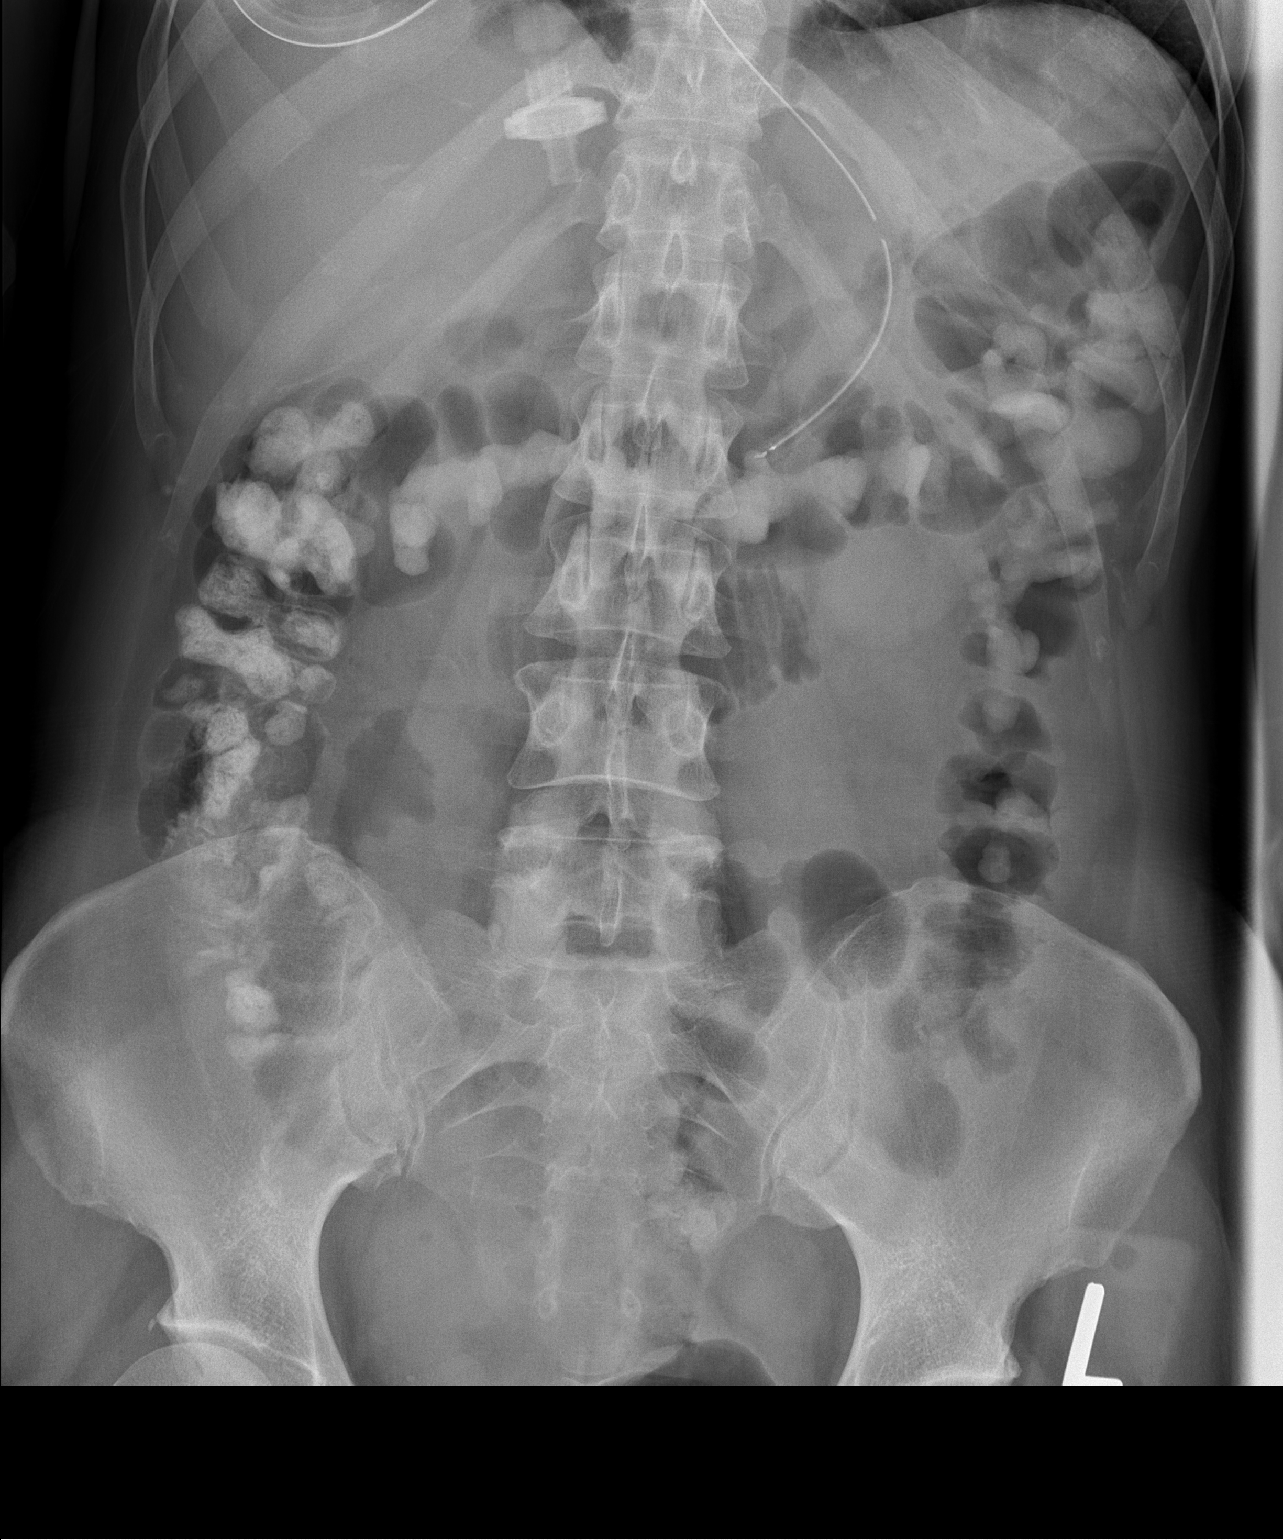

[2 of 2 positions shown; findings below may reference images not displayed]

FINDINGS: NG tube remains in place.  No free air is identified.
Gaseous distention of small bowel has improved.  Contrast material
is seen in the colon.
IMPRESSION: Improved small bowel obstruction.

## 2015-06-30 ENCOUNTER — Other Ambulatory Visit: Payer: Self-pay | Admitting: Family Medicine

## 2015-06-30 DIAGNOSIS — E049 Nontoxic goiter, unspecified: Secondary | ICD-10-CM

## 2015-07-21 ENCOUNTER — Ambulatory Visit
Admission: RE | Admit: 2015-07-21 | Discharge: 2015-07-21 | Disposition: A | Discharge status: Home / Self Care | Payer: Managed Care, Other (non HMO) | Source: Ambulatory Visit | Attending: Family Medicine | Admitting: Family Medicine

## 2015-07-21 DIAGNOSIS — E049 Nontoxic goiter, unspecified: Secondary | ICD-10-CM

## 2016-07-31 ENCOUNTER — Other Ambulatory Visit: Payer: Self-pay | Admitting: Family Medicine

## 2016-07-31 DIAGNOSIS — E049 Nontoxic goiter, unspecified: Secondary | ICD-10-CM

## 2016-08-03 ENCOUNTER — Other Ambulatory Visit: Payer: Managed Care, Other (non HMO)

## 2016-08-24 ENCOUNTER — Ambulatory Visit
Admission: RE | Admit: 2016-08-24 | Discharge: 2016-08-24 | Disposition: A | Discharge status: Home / Self Care | Payer: Managed Care, Other (non HMO) | Source: Ambulatory Visit | Attending: Family Medicine | Admitting: Family Medicine

## 2016-08-24 DIAGNOSIS — E049 Nontoxic goiter, unspecified: Secondary | ICD-10-CM

## 2016-10-25 ENCOUNTER — Other Ambulatory Visit: Payer: Self-pay | Admitting: Family Medicine

## 2016-10-25 DIAGNOSIS — R590 Localized enlarged lymph nodes: Secondary | ICD-10-CM

## 2016-10-30 ENCOUNTER — Other Ambulatory Visit: Payer: Managed Care, Other (non HMO)

## 2016-11-02 ENCOUNTER — Ambulatory Visit
Admission: RE | Admit: 2016-11-02 | Discharge: 2016-11-02 | Disposition: A | Discharge status: Home / Self Care | Payer: Managed Care, Other (non HMO) | Source: Ambulatory Visit | Attending: Family Medicine | Admitting: Family Medicine

## 2016-11-02 DIAGNOSIS — R590 Localized enlarged lymph nodes: Secondary | ICD-10-CM

## 2016-11-16 ENCOUNTER — Ambulatory Visit
Admission: RE | Admit: 2016-11-16 | Discharge: 2016-11-16 | Disposition: A | Discharge status: Home / Self Care | Payer: Managed Care, Other (non HMO) | Source: Ambulatory Visit | Attending: Otolaryngology | Admitting: Otolaryngology

## 2016-11-16 ENCOUNTER — Other Ambulatory Visit: Payer: Self-pay | Admitting: Otolaryngology

## 2016-11-16 DIAGNOSIS — R221 Localized swelling, mass and lump, neck: Secondary | ICD-10-CM

## 2016-11-16 MED ORDER — IOPAMIDOL (ISOVUE-300) INJECTION 61%
75.0000 mL | Freq: Once | INTRAVENOUS | Status: AC | PRN
  Administered 2016-11-16: 75 mL via INTRAVENOUS

## 2016-11-22 ENCOUNTER — Encounter (HOSPITAL_BASED_OUTPATIENT_CLINIC_OR_DEPARTMENT_OTHER): Payer: Self-pay | Admitting: *Deleted

## 2016-11-26 NOTE — H&P (Signed)
HPI:   Vanessa Holloway is a 51 y.o. female who presents as a consult Patient.   Referring Provider: Carolyne Fiscal, *  Chief complaint: Neck mass.  HPI: She noticed a small lump in the neck on the right side in February. About 3 weeks ago she noticed a getting larger. She has a history of thyroid nodules that have been followed with ultrasound. She had an ultrasound of the neck mass last week which revealed a 3 cm lymph node. She is a longtime smoker. She denies any throat symptoms or any other complaints.  PMH/Meds/All/SocHx/FamHx/ROS:   Past Medical History:  Diagnosis Date  . Arthritis   Past Surgical History:  Procedure Laterality Date  . HERNIA REPAIR  . TUBAL LIGATION   No family history of bleeding disorders, wound healing problems or difficulty with anesthesia.   Social History   Social History  . Marital status: N/A  Spouse name: N/A  . Number of children: N/A  . Years of education: N/A   Occupational History  . Not on file.   Social History Main Topics  . Smoking status: Current Some Day Smoker  . Smokeless tobacco: Never Used  . Alcohol use Not on file  . Drug use: Unknown  . Sexual activity: Not on file   Other Topics Concern  . Not on file   Social History Narrative  . No narrative on file   Current Outpatient Prescriptions:  . ADVAIR DISKUS 250-50 mcg/dose diskus inhaler, , Disp: , Rfl:   A complete ROS was performed with pertinent positives/negatives noted in the HPI. The remainder of the ROS are negative.   Physical Exam:   Ht 1.651 m (5\' 5" )  Wt 62.6 kg (138 lb)  BMI 22.96 kg/m   General: Healthy and alert, in no distress, breathing easily. Normal affect. In a pleasant mood. Head: Normocephalic, atraumatic. No masses, or scars. Eyes: Pupils are equal, and reactive to light. Vision is grossly intact. No spontaneous or gaze nystagmus. Ears: Ear canals are clear. Tympanic membranes are intact, with normal landmarks and the  middle ears are clear and healthy. Hearing: Grossly normal. Nose: Nasal cavities are clear with healthy mucosa, no polyps or exudate.Airways are patent. Face: No masses or scars, facial nerve function is symmetric. Oral Cavity: No mucosal abnormalities are noted. Tongue with normal mobility. Dentition appears healthy. Oropharynx: Tonsils are symmetric. There are no mucosal masses identified. Tongue base appears normal and healthy. Larynx/Hypopharynx: indirect exam reveals healthy, mobile vocal cords, without mucosal lesions in the hypopharynx or larynx. Chest: Deferred Neck: There is a 3 cm soft elongated level 2 node on the right, no other adenopathy, no thyroid nodules or enlargement. Neuro: Cranial nerves II-XII will normal function. Balance: Normal gate. Other findings: none.  Independent Review of Additional Tests or Records:  none  Procedures:  Procedure Note:  Indications for procedure: neck mass  Details of the procedure were discussed with the patient and all questions were answered.  Procedure:  2% xylocaine with epinephrine was infiltrated into the overlying skin. First pass was made with a 25 gauge needle and 10 cc syringe. Second pass was made with a 22 gauge needle. Specimen was placed on microscopic slides and air-dried. Additional material was placed in Cytolyte solution for cell block preparation. A third pass was made with a 22 guage needle and sample was added to the cytolyte solution.  A bandage was applied.   She tolerated the procedure well. Results will be discussed when available.  Impression &  Plans:  Suspicious lymphadenopathy and us longtime smoker. This is concerning for possible metastatic malignancy. We will see what the FNA reveals. We will discuss further treatment after that. Recommend strongly that she try to stop smoking.

## 2016-12-03 ENCOUNTER — Encounter (HOSPITAL_BASED_OUTPATIENT_CLINIC_OR_DEPARTMENT_OTHER): Payer: Self-pay | Admitting: *Deleted

## 2016-12-03 ENCOUNTER — Ambulatory Visit (HOSPITAL_BASED_OUTPATIENT_CLINIC_OR_DEPARTMENT_OTHER): Payer: Managed Care, Other (non HMO) | Admitting: Anesthesiology

## 2016-12-03 ENCOUNTER — Ambulatory Visit (HOSPITAL_BASED_OUTPATIENT_CLINIC_OR_DEPARTMENT_OTHER)
Admission: RE | Admit: 2016-12-03 | Discharge: 2016-12-03 | Disposition: A | Discharge status: Home / Self Care | Payer: Managed Care, Other (non HMO) | Source: Ambulatory Visit | Attending: Otolaryngology | Admitting: Otolaryngology

## 2016-12-03 ENCOUNTER — Encounter (HOSPITAL_BASED_OUTPATIENT_CLINIC_OR_DEPARTMENT_OTHER)
Admission: RE | Disposition: A | Discharge status: Home / Self Care | Payer: Self-pay | Source: Ambulatory Visit | Attending: Otolaryngology

## 2016-12-03 DIAGNOSIS — R221 Localized swelling, mass and lump, neck: Secondary | ICD-10-CM | POA: Diagnosis present

## 2016-12-03 DIAGNOSIS — F17200 Nicotine dependence, unspecified, uncomplicated: Secondary | ICD-10-CM | POA: Diagnosis not present

## 2016-12-03 DIAGNOSIS — Z79899 Other long term (current) drug therapy: Secondary | ICD-10-CM | POA: Insufficient documentation

## 2016-12-03 DIAGNOSIS — J449 Chronic obstructive pulmonary disease, unspecified: Secondary | ICD-10-CM | POA: Insufficient documentation

## 2016-12-03 DIAGNOSIS — Q180 Sinus, fistula and cyst of branchial cleft: Secondary | ICD-10-CM | POA: Insufficient documentation

## 2016-12-03 HISTORY — DX: Chronic obstructive pulmonary disease, unspecified: J44.9

## 2016-12-03 HISTORY — PX: MASS BIOPSY: SHX5445

## 2016-12-03 SURGERY — BIOPSY, MASS, NECK
Anesthesia: General | Site: Neck | Laterality: Right

## 2016-12-03 MED ORDER — SUCCINYLCHOLINE CHLORIDE 200 MG/10ML IV SOSY
PREFILLED_SYRINGE | INTRAVENOUS | Status: DC | PRN
  Administered 2016-12-03: 100 mg via INTRAVENOUS

## 2016-12-03 MED ORDER — DEXTROSE-NACL 5-0.9 % IV SOLN
INTRAVENOUS | Status: DC
  Administered 2016-12-03: 13:00:00 via INTRAVENOUS

## 2016-12-03 MED ORDER — PHENYLEPHRINE 40 MCG/ML (10ML) SYRINGE FOR IV PUSH (FOR BLOOD PRESSURE SUPPORT)
PREFILLED_SYRINGE | INTRAVENOUS | Status: DC | PRN
  Administered 2016-12-03: 120 ug via INTRAVENOUS

## 2016-12-03 MED ORDER — PROMETHAZINE HCL 25 MG RE SUPP: 25.0000 mg | Freq: Four times a day (QID) | RECTAL | Status: DC | PRN

## 2016-12-03 MED ORDER — GLYCOPYRROLATE 0.2 MG/ML IV SOSY
PREFILLED_SYRINGE | INTRAVENOUS | Status: DC | PRN
  Administered 2016-12-03: .2 mg via INTRAVENOUS

## 2016-12-03 MED ORDER — FENTANYL CITRATE (PF) 100 MCG/2ML IJ SOLN
INTRAMUSCULAR | Status: AC
  Filled 2016-12-03: qty 2

## 2016-12-03 MED ORDER — EPHEDRINE 5 MG/ML INJ
INTRAVENOUS | Status: AC
  Filled 2016-12-03: qty 10

## 2016-12-03 MED ORDER — OXYMETAZOLINE HCL 0.05 % NA SOLN
NASAL | Status: AC
  Filled 2016-12-03: qty 15

## 2016-12-03 MED ORDER — LIDOCAINE 2% (20 MG/ML) 5 ML SYRINGE
INTRAMUSCULAR | Status: DC | PRN
  Administered 2016-12-03: 60 mg via INTRAVENOUS

## 2016-12-03 MED ORDER — FENTANYL CITRATE (PF) 100 MCG/2ML IJ SOLN
INTRAMUSCULAR | Status: AC
Start: 2016-12-03 — End: 2016-12-03
  Filled 2016-12-03: qty 2

## 2016-12-03 MED ORDER — HYDROCODONE-ACETAMINOPHEN 5-325 MG PO TABS
ORAL_TABLET | ORAL | Status: AC
  Filled 2016-12-03: qty 1

## 2016-12-03 MED ORDER — SUCCINYLCHOLINE CHLORIDE 200 MG/10ML IV SOSY
PREFILLED_SYRINGE | INTRAVENOUS | Status: AC
  Filled 2016-12-03: qty 10

## 2016-12-03 MED ORDER — MIDAZOLAM HCL 2 MG/2ML IJ SOLN
INTRAMUSCULAR | Status: AC
  Filled 2016-12-03: qty 2

## 2016-12-03 MED ORDER — CEPHALEXIN 250 MG/5ML PO SUSR
500.0000 mg | Freq: Two times a day (BID) | ORAL | Status: DC
  Filled 2016-12-03 (×2): qty 10

## 2016-12-03 MED ORDER — DEXAMETHASONE SODIUM PHOSPHATE 4 MG/ML IJ SOLN
INTRAMUSCULAR | Status: DC | PRN
  Administered 2016-12-03: 10 mg via INTRAVENOUS

## 2016-12-03 MED ORDER — PROMETHAZINE HCL 25 MG PO TABS: 25.0000 mg | ORAL_TABLET | Freq: Four times a day (QID) | ORAL | Status: DC | PRN

## 2016-12-03 MED ORDER — HYDROCODONE-ACETAMINOPHEN 5-325 MG PO TABS
1.0000 | ORAL_TABLET | ORAL | Status: DC | PRN
  Administered 2016-12-03 (×2): 1 via ORAL
  Filled 2016-12-03: qty 1

## 2016-12-03 MED ORDER — EPHEDRINE SULFATE-NACL 50-0.9 MG/10ML-% IV SOSY
PREFILLED_SYRINGE | INTRAVENOUS | Status: DC | PRN
  Administered 2016-12-03 (×5): 10 mg via INTRAVENOUS

## 2016-12-03 MED ORDER — SCOPOLAMINE 1 MG/3DAYS TD PT72: 1.0000 | MEDICATED_PATCH | Freq: Once | TRANSDERMAL | Status: DC | PRN

## 2016-12-03 MED ORDER — OXYCODONE HCL 5 MG PO TABS: 5.0000 mg | ORAL_TABLET | Freq: Once | ORAL | Status: DC | PRN

## 2016-12-03 MED ORDER — MIDAZOLAM HCL 2 MG/2ML IJ SOLN
1.0000 mg | INTRAMUSCULAR | Status: DC | PRN
  Administered 2016-12-03: 2 mg via INTRAVENOUS

## 2016-12-03 MED ORDER — MOMETASONE FURO-FORMOTEROL FUM 200-5 MCG/ACT IN AERO: 2.0000 | INHALATION_SPRAY | Freq: Two times a day (BID) | RESPIRATORY_TRACT | Status: DC

## 2016-12-03 MED ORDER — ONDANSETRON HCL 4 MG/2ML IJ SOLN
INTRAMUSCULAR | Status: AC
  Filled 2016-12-03: qty 2

## 2016-12-03 MED ORDER — PROMETHAZINE HCL 25 MG RE SUPP: 25.0000 mg | Freq: Four times a day (QID) | RECTAL | 1 refills | Status: AC | PRN

## 2016-12-03 MED ORDER — HYDROCODONE-ACETAMINOPHEN 7.5-325 MG PO TABS: 1.0000 | ORAL_TABLET | Freq: Four times a day (QID) | ORAL | 0 refills | Status: AC | PRN

## 2016-12-03 MED ORDER — PROPOFOL 10 MG/ML IV BOLUS
INTRAVENOUS | Status: DC | PRN
  Administered 2016-12-03: 170 mg via INTRAVENOUS
  Administered 2016-12-03: 20 mg via INTRAVENOUS
  Administered 2016-12-03: 30 mg via INTRAVENOUS

## 2016-12-03 MED ORDER — CEPHALEXIN 500 MG PO CAPS: 500.0000 mg | ORAL_CAPSULE | Freq: Three times a day (TID) | ORAL | 0 refills | Status: AC

## 2016-12-03 MED ORDER — IBUPROFEN 100 MG/5ML PO SUSP
400.0000 mg | Freq: Four times a day (QID) | ORAL | Status: DC | PRN
  Administered 2016-12-03: 400 mg via ORAL

## 2016-12-03 MED ORDER — FENTANYL CITRATE (PF) 100 MCG/2ML IJ SOLN
25.0000 ug | INTRAMUSCULAR | Status: DC | PRN
  Administered 2016-12-03 (×2): 50 ug via INTRAVENOUS
  Administered 2016-12-03 (×2): 25 ug via INTRAVENOUS

## 2016-12-03 MED ORDER — OXYCODONE HCL 5 MG/5ML PO SOLN: 5.0000 mg | Freq: Once | ORAL | Status: DC | PRN

## 2016-12-03 MED ORDER — IBUPROFEN 100 MG/5ML PO SUSP
ORAL | Status: AC
  Filled 2016-12-03: qty 20

## 2016-12-03 MED ORDER — DEXAMETHASONE SODIUM PHOSPHATE 10 MG/ML IJ SOLN
INTRAMUSCULAR | Status: AC
  Filled 2016-12-03: qty 1

## 2016-12-03 MED ORDER — ONDANSETRON HCL 4 MG/2ML IJ SOLN
4.0000 mg | Freq: Four times a day (QID) | INTRAMUSCULAR | Status: AC | PRN
  Administered 2016-12-03: 4 mg via INTRAVENOUS

## 2016-12-03 MED ORDER — PHENYLEPHRINE 40 MCG/ML (10ML) SYRINGE FOR IV PUSH (FOR BLOOD PRESSURE SUPPORT)
PREFILLED_SYRINGE | INTRAVENOUS | Status: AC
  Filled 2016-12-03: qty 10

## 2016-12-03 MED ORDER — PROPOFOL 10 MG/ML IV BOLUS
INTRAVENOUS | Status: AC
  Filled 2016-12-03: qty 20

## 2016-12-03 MED ORDER — IBUPROFEN 200 MG PO TABS
ORAL_TABLET | ORAL | Status: AC
  Filled 2016-12-03: qty 2

## 2016-12-03 MED ORDER — LIDOCAINE-EPINEPHRINE 1 %-1:100000 IJ SOLN
INTRAMUSCULAR | Status: AC
  Filled 2016-12-03: qty 1

## 2016-12-03 MED ORDER — FENTANYL CITRATE (PF) 100 MCG/2ML IJ SOLN
50.0000 ug | INTRAMUSCULAR | Status: DC | PRN
  Administered 2016-12-03: 100 ug via INTRAVENOUS

## 2016-12-03 MED ORDER — LACTATED RINGERS IV SOLN
INTRAVENOUS | Status: DC
  Administered 2016-12-03 (×3): via INTRAVENOUS

## 2016-12-03 SURGICAL SUPPLY — 60 items
ADH SKN CLS APL DERMABOND .7 (GAUZE/BANDAGES/DRESSINGS) ×1
APL SKNCLS STERI-STRIP NONHPOA (GAUZE/BANDAGES/DRESSINGS)
ATTRACTOMAT 16X20 MAGNETIC DRP (DRAPES) IMPLANT
BENZOIN TINCTURE PRP APPL 2/3 (GAUZE/BANDAGES/DRESSINGS) IMPLANT
BLADE SURG 15 STRL LF DISP TIS (BLADE) ×1 IMPLANT
BLADE SURG 15 STRL SS (BLADE) ×3
CANISTER SUCT 1200ML W/VALVE (MISCELLANEOUS) ×3 IMPLANT
CLEANER CAUTERY TIP 5X5 PAD (MISCELLANEOUS) ×1 IMPLANT
CLOSURE WOUND 1/2 X4 (GAUZE/BANDAGES/DRESSINGS)
CORD BIPOLAR FORCEPS 12FT (ELECTRODE) IMPLANT
COVER BACK TABLE 60X90IN (DRAPES) ×3 IMPLANT
COVER MAYO STAND STRL (DRAPES) ×3 IMPLANT
DERMABOND ADVANCED (GAUZE/BANDAGES/DRESSINGS) ×2
DERMABOND ADVANCED .7 DNX12 (GAUZE/BANDAGES/DRESSINGS) IMPLANT
DRAIN JACKSON RD 7FR 3/32 (WOUND CARE) IMPLANT
DRAIN PENROSE 1/4X12 LTX STRL (WOUND CARE) ×2 IMPLANT
DRAPE U-SHAPE 76X120 STRL (DRAPES) ×3 IMPLANT
ELECT COATED BLADE 2.86 ST (ELECTRODE) ×3 IMPLANT
ELECT REM PT RETURN 9FT ADLT (ELECTROSURGICAL) ×3
ELECTRODE REM PT RTRN 9FT ADLT (ELECTROSURGICAL) ×1 IMPLANT
EVACUATOR SILICONE 100CC (DRAIN) IMPLANT
GAUZE SPONGE 4X4 12PLY STRL (GAUZE/BANDAGES/DRESSINGS) ×2 IMPLANT
GAUZE SPONGE 4X4 12PLY STRL LF (GAUZE/BANDAGES/DRESSINGS) IMPLANT
GAUZE SPONGE 4X4 16PLY XRAY LF (GAUZE/BANDAGES/DRESSINGS) IMPLANT
GLOVE BIO SURGEON STRL SZ7 (GLOVE) ×2 IMPLANT
GLOVE BIOGEL PI IND STRL 6.5 (GLOVE) IMPLANT
GLOVE BIOGEL PI IND STRL 7.5 (GLOVE) IMPLANT
GLOVE BIOGEL PI INDICATOR 6.5 (GLOVE) ×2
GLOVE BIOGEL PI INDICATOR 7.5 (GLOVE) ×2
GLOVE ECLIPSE 7.5 STRL STRAW (GLOVE) ×3 IMPLANT
GOWN STRL REUS W/ TWL LRG LVL3 (GOWN DISPOSABLE) ×1 IMPLANT
GOWN STRL REUS W/TWL LRG LVL3 (GOWN DISPOSABLE) ×6
NDL PRECISIONGLIDE 27X1.5 (NEEDLE) IMPLANT
NEEDLE PRECISIONGLIDE 27X1.5 (NEEDLE) IMPLANT
NS IRRIG 1000ML POUR BTL (IV SOLUTION) ×2 IMPLANT
PACK BASIN DAY SURGERY FS (CUSTOM PROCEDURE TRAY) ×3 IMPLANT
PAD CLEANER CAUTERY TIP 5X5 (MISCELLANEOUS) ×2
PENCIL FOOT CONTROL (ELECTRODE) ×3 IMPLANT
RUBBERBAND STERILE (MISCELLANEOUS) IMPLANT
SLEEVE SCD COMPRESS KNEE MED (MISCELLANEOUS) ×2 IMPLANT
SPONGE GAUZE 2X2 8PLY STER LF (GAUZE/BANDAGES/DRESSINGS)
SPONGE GAUZE 2X2 8PLY STRL LF (GAUZE/BANDAGES/DRESSINGS) IMPLANT
STAPLER VISISTAT 35W (STAPLE) IMPLANT
STRIP CLOSURE SKIN 1/2X4 (GAUZE/BANDAGES/DRESSINGS) IMPLANT
SUCTION FRAZIER HANDLE 10FR (MISCELLANEOUS)
SUCTION TUBE FRAZIER 10FR DISP (MISCELLANEOUS) IMPLANT
SUT CHROMIC 3 0 PS 2 (SUTURE) ×2 IMPLANT
SUT CHROMIC 4 0 P 3 18 (SUTURE) IMPLANT
SUT ETHILON 4 0 PS 2 18 (SUTURE) IMPLANT
SUT ETHILON 5 0 P 3 18 (SUTURE)
SUT NYLON ETHILON 5-0 P-3 1X18 (SUTURE) IMPLANT
SUT PLAIN 5 0 P 3 18 (SUTURE) IMPLANT
SUT SILK 3 0 SH CR/8 (SUTURE) ×2 IMPLANT
SUT SILK 4 0 TIES 17X18 (SUTURE) ×2 IMPLANT
SUT VICRYL 4-0 PS2 18IN ABS (SUTURE) IMPLANT
SYR BULB 3OZ (MISCELLANEOUS) ×2 IMPLANT
SYR CONTROL 10ML LL (SYRINGE) ×1 IMPLANT
TRAY DSU PREP LF (CUSTOM PROCEDURE TRAY) ×3 IMPLANT
TUBE CONNECTING 20'X1/4 (TUBING) ×1
TUBE CONNECTING 20X1/4 (TUBING) ×2 IMPLANT

## 2016-12-03 NOTE — Discharge Instructions (Signed)
You may shower and use soap and water. Do not use any creams, oils or ointment. ° °Post Anesthesia Home Care Instructions ° °Activity: °Get plenty of rest for the remainder of the day. A responsible individual must stay with you for 24 hours following the procedure.  °For the next 24 hours, DO NOT: °-Drive a car °-Operate machinery °-Drink alcoholic beverages °-Take any medication unless instructed by your physician °-Make any legal decisions or sign important papers. ° °Meals: °Start with liquid foods such as gelatin or soup. Progress to regular foods as tolerated. Avoid greasy, spicy, heavy foods. If nausea and/or vomiting occur, drink only clear liquids until the nausea and/or vomiting subsides. Call your physician if vomiting continues. ° °Special Instructions/Symptoms: °Your throat may feel dry or sore from the anesthesia or the breathing tube placed in your throat during surgery. If this causes discomfort, gargle with warm salt water. The discomfort should disappear within 24 hours. ° °If you had a scopolamine patch placed behind your ear for the management of post- operative nausea and/or vomiting: ° °1. The medication in the patch is effective for 72 hours, after which it should be removed.  Wrap patch in a tissue and discard in the trash. Wash hands thoroughly with soap and water. °2. You may remove the patch earlier than 72 hours if you experience unpleasant side effects which may include dry mouth, dizziness or visual disturbances. °3. Avoid touching the patch. Wash your hands with soap and water after contact with the patch. °   ° °

## 2016-12-03 NOTE — Anesthesia Preprocedure Evaluation (Signed)
Anesthesia Evaluation  Patient identified by MRN, date of birth, ID band Patient awake    Reviewed: Allergy & Precautions, H&P , NPO status , Patient's Chart, lab work & pertinent test results  Airway Mallampati: II   Neck ROM: full    Dental   Pulmonary COPD, former smoker,    breath sounds clear to auscultation       Cardiovascular negative cardio ROS   Rhythm:regular Rate:Normal     Neuro/Psych    GI/Hepatic   Endo/Other    Renal/GU      Musculoskeletal   Abdominal   Peds  Hematology   Anesthesia Other Findings   Reproductive/Obstetrics                             Anesthesia Physical Anesthesia Plan  ASA: II  Anesthesia Plan: General   Post-op Pain Management:    Induction: Intravenous  PONV Risk Score and Plan: 3 and Ondansetron, Dexamethasone, Propofol, Midazolam and Treatment may vary due to age or medical condition  Airway Management Planned: Oral ETT  Additional Equipment:   Intra-op Plan:   Post-operative Plan: Extubation in OR  Informed Consent: I have reviewed the patients History and Physical, chart, labs and discussed the procedure including the risks, benefits and alternatives for the proposed anesthesia with the patient or authorized representative who has indicated his/her understanding and acceptance.     Plan Discussed with: CRNA, Anesthesiologist and Surgeon  Anesthesia Plan Comments:         Anesthesia Quick Evaluation

## 2016-12-03 NOTE — Anesthesia Postprocedure Evaluation (Signed)
Anesthesia Post Note  Patient: Drue FlirtChristine Ellen  Procedure(s) Performed: Procedure(s) (LRB): EXCISION OF RIGHT NECK MASS (Right)     Patient location during evaluation: PACU Anesthesia Type: General Level of consciousness: awake and alert Pain management: pain level controlled Vital Signs Assessment: post-procedure vital signs reviewed and stable Respiratory status: spontaneous breathing, nonlabored ventilation, respiratory function stable and patient connected to nasal cannula oxygen Cardiovascular status: blood pressure returned to baseline and stable Postop Assessment: no signs of nausea or vomiting Anesthetic complications: no    Last Vitals:  Vitals:   12/03/16 1230 12/03/16 1245  BP: (!) 106/51 105/60  Pulse: 63 62  Resp: 18 16  Temp:      Last Pain:  Vitals:   12/03/16 1245  TempSrc:   PainSc: 8                  Janal Haak

## 2016-12-03 NOTE — Anesthesia Procedure Notes (Signed)
Procedure Name: Intubation Date/Time: 12/03/2016 10:00 AM Performed by: Lyndee Leo Pre-anesthesia Checklist: Patient identified, Emergency Drugs available, Suction available and Patient being monitored Patient Re-evaluated:Patient Re-evaluated prior to inductionOxygen Delivery Method: Circle system utilized Preoxygenation: Pre-oxygenation with 100% oxygen Intubation Type: IV induction Ventilation: Mask ventilation without difficulty Laryngoscope Size: Mac and 3 Grade View: Grade I Tube type: Oral Tube size: 7.0 mm Number of attempts: 1 Airway Equipment and Method: Stylet and Oral airway Placement Confirmation: ETT inserted through vocal cords under direct vision,  positive ETCO2 and breath sounds checked- equal and bilateral Tube secured with: Tape Dental Injury: Teeth and Oropharynx as per pre-operative assessment

## 2016-12-03 NOTE — Transfer of Care (Signed)
Immediate Anesthesia Transfer of Care Note  Patient: Vanessa Holloway  Procedure(s) Performed: Procedure(s): EXCISION OF RIGHT NECK MASS (Right)  Patient Location: PACU  Anesthesia Type:General  Level of Consciousness: awake, sedated and patient cooperative  Airway & Oxygen Therapy: Patient Spontanous Breathing and Patient connected to face mask oxygen  Post-op Assessment: Report given to RN and Post -op Vital signs reviewed and stable  Post vital signs: Reviewed and stable  Last Vitals:  Vitals:   12/03/16 0924  BP: (!) 99/51  Pulse: 74  Resp: 18  Temp: 36.8 C    Last Pain:  Vitals:   12/03/16 0924  TempSrc: Oral         Complications: No apparent anesthesia complications

## 2016-12-03 NOTE — Interval H&P Note (Signed)
History and Physical Interval Note:  12/03/2016 9:30 AM  Vanessa Holloway  has presented today for surgery, with the diagnosis of NECK MASS RIGHT SIDE CERVICAL  The various methods of treatment have been discussed with the patient and family. After consideration of risks, benefits and other options for treatment, the patient has consented to  Procedure(s): EXCISION OF RIGHT NECK MASS (Right) as a surgical intervention .  The patient's history has been reviewed, patient examined, no change in status, stable for surgery.  I have reviewed the patient's chart and labs.  Questions were answered to the patient's satisfaction.     Lacretia Tindall

## 2016-12-03 NOTE — Op Note (Signed)
OPERATIVE REPORT  DATE OF SURGERY: 12/03/2016  PATIENT:  Vanessa Holloway,  51 y.o. female  PRE-OPERATIVE DIAGNOSIS:  NECK MASS RIGHT SIDE, CERVICAL  POST-OPERATIVE DIAGNOSIS:  NECK MASS RIGHT SIDE, CERVICAL  PROCEDURE:  Procedure(s): EXCISION OF RIGHT NECK MASS  SURGEON:  Susy FrizzleJefry H Rylynn Kobs, MD  ASSISTANTS: None  ANESTHESIA:   General   EBL:  20 ml  DRAINS: Quarter-inch Penrose  LOCAL MEDICATIONS USED:  None  SPECIMEN:  Right level II neck mass  COUNTS:  Correct  PROCEDURE DETAILS: The patient was taken to the operating room and placed on the operating table in the supine position. Following induction of general endotracheal anesthesia, the right neck was prepped and draped in a standard fashion. The mass was identified by palpation.   A cervical skin crease underlying the mass was outlined with a marking pen and the incision was created using electrocautery. The platysma was divided and a subplatysmal flap was elevated superiorly and inferiorly. Self-retaining retractor was then used. The cervical and marginal branch of the facial nerve were identified and reflected posteriorly and superiorly respectively.   The intervening fascia was then dissected off of the sternocleidomastoid muscle and brought forward. The mass was identified. It was soft and contained thick fluid. During manipulation of the mass the contents were spilled and contained a thick greenish fluid. The mass was dissected from surrounding structures. It was dissected off of the internal jugular vein. It was dissected off the spinal accessory nerve and brought anterior. It was dissected off of the hypoglossal nerve and brought inferiorly. Branches of the jugular system were ligated using 4-0 silk ties. The occipital artery was also ligated with a 4-0 silk tie in order to dissected off of that structure.   The mass was removed and sent with some surrounding fibrofatty tissue and lymph node bearing tissue for  pathologic evaluation. The wound was irrigated with saline. The Penrose drain was placed into the wound and exited posteriorly. The platysma layer was reapproximated with 3-0 chromic suture. A 3-0 chromic running subcuticular closure was accomplished and Dermabond was used on the skin. A dressing was applied. Patient was awakened extubated and transferred to recovery in stable condition.    PATIENT DISPOSITION:  To PACU, stable

## 2016-12-04 ENCOUNTER — Encounter (HOSPITAL_BASED_OUTPATIENT_CLINIC_OR_DEPARTMENT_OTHER): Payer: Self-pay | Admitting: Otolaryngology

## 2017-08-26 ENCOUNTER — Ambulatory Visit
Admission: RE | Admit: 2017-08-26 | Discharge: 2017-08-26 | Disposition: A | Discharge status: Home / Self Care | Payer: 59 | Source: Ambulatory Visit | Attending: Family Medicine | Admitting: Family Medicine

## 2017-08-26 ENCOUNTER — Other Ambulatory Visit: Payer: Self-pay | Admitting: Family Medicine

## 2017-08-26 DIAGNOSIS — R059 Cough, unspecified: Secondary | ICD-10-CM

## 2017-08-26 DIAGNOSIS — R05 Cough: Secondary | ICD-10-CM

## 2017-08-26 DIAGNOSIS — E049 Nontoxic goiter, unspecified: Secondary | ICD-10-CM

## 2017-09-23 ENCOUNTER — Ambulatory Visit
Admission: RE | Admit: 2017-09-23 | Discharge: 2017-09-23 | Disposition: A | Discharge status: Home / Self Care | Payer: Managed Care, Other (non HMO) | Source: Ambulatory Visit | Attending: Family Medicine | Admitting: Family Medicine

## 2017-09-23 DIAGNOSIS — E049 Nontoxic goiter, unspecified: Secondary | ICD-10-CM
